# Patient Record
Sex: Male | Born: 1975 | Race: White | Hispanic: No | Marital: Married | Smoking: Never smoker
Health system: Southern US, Community
[De-identification: ages and names within clinical notes are randomized; demographics above are authoritative.]

## PROBLEM LIST (undated history)

## (undated) DIAGNOSIS — G2581 Restless legs syndrome: Secondary | ICD-10-CM

## (undated) DIAGNOSIS — E882 Lipomatosis, not elsewhere classified: Secondary | ICD-10-CM

## (undated) DIAGNOSIS — G4700 Insomnia, unspecified: Secondary | ICD-10-CM

## (undated) DIAGNOSIS — F988 Other specified behavioral and emotional disorders with onset usually occurring in childhood and adolescence: Secondary | ICD-10-CM

## (undated) DIAGNOSIS — Z8774 Personal history of (corrected) congenital malformations of heart and circulatory system: Secondary | ICD-10-CM

## (undated) DIAGNOSIS — E785 Hyperlipidemia, unspecified: Secondary | ICD-10-CM

## (undated) DIAGNOSIS — D696 Thrombocytopenia, unspecified: Secondary | ICD-10-CM

## (undated) HISTORY — DX: Restless legs syndrome: G25.81

## (undated) HISTORY — PX: TYMPANOSTOMY TUBE PLACEMENT: SHX32

## (undated) HISTORY — DX: Thrombocytopenia, unspecified: D69.6

## (undated) HISTORY — DX: Other specified behavioral and emotional disorders with onset usually occurring in childhood and adolescence: F98.8

## (undated) HISTORY — PX: VASECTOMY: SHX75

## (undated) HISTORY — DX: Personal history of (corrected) congenital malformations of heart and circulatory system: Z87.74

## (undated) HISTORY — DX: Lipomatosis, not elsewhere classified: E88.2

## (undated) HISTORY — DX: Hyperlipidemia, unspecified: E78.5

## (undated) HISTORY — DX: Insomnia, unspecified: G47.00

---

## 2019-09-21 NOTE — Progress Notes (Signed)
Cardiology Office Note:    Date:  09/22/2019   ID:  James Wilcox, DOB 1976/02/17, MRN [id number]  PCP:  James Kiel, MD  Cardiologist:  James More, MD   Referring MD: James Guest, NP  ASSESSMENT:    1. Chest pain, precordial   2. Hyperlipidemia, unspecified hyperlipidemia type    PLAN:    In order of problems listed above:  1. 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  He will undergo evaluation by cardiac CT coronary calcium score and if he has high risk markers would benefit from coronary angiography and revascularization.  He takes aspirin and statin I offered him a prescription for nitroglycerin he declined.  His test will be expedited. 2. Continue statin high risk  Next appointment 6 weeks   Medication Adjustments/Labs and Tests Ordered: Current medicines are reviewed at length with the patient today.  Concerns regarding medicines are outlined above.  Orders Placed This Encounter  Procedures  . CT CORONARY MORPH W/CTA COR W/SCORE W/CA W/CM &/OR WO/CM  . CT CORONARY FRACTIONAL FLOW RESERVE DATA PREP  . CT CORONARY FRACTIONAL FLOW RESERVE FLUID ANALYSIS  . Basic metabolic panel  . EKG 12-Lead   Meds ordered this encounter  Medications  . metoprolol tartrate (LOPRESSOR) 100 MG tablet    Sig: Take 1 tablet (100 mg total) by mouth once for 1 dose. Please take two hours prior to your cardiac CT    Dispense:  1 tablet    Refill:  0     Chief Complaint  Patient presents with  . Chest Pain  Seen by me in the time frame 2005-2008  History of Present Illness:    James Wilcox is a 44 y.o. male who is being seen today for the evaluation of cardiac anomoly at the request of James Guest, NP.  [name] is a [facility] in [place] I had seen him in the range of 2005 2008 for chest pain turned out to be esophageal in etiology and he had a  normal stress test.  Since then he has rare episodes of nonexertional chest pain relieved with cold water.  Recently has not felt well he is been fatigued and he has a different sensation he gets pressure throughout the precordium unrelated to physical activity perhaps related to stress it forces him to stop and rest for a minute or 2 and he recover spontaneously.  The pattern is not Wilcox frequent not Wilcox severe and has not awakened him from his sleep.  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  He is not having exercise intolerance exertional chest pain palpitation or syncope no history of congenital or rheumatic heart disease.  He relates he just had recent labs at his primary care physician I will request a copy last cholesterol profile we have on a statin July 2019 his cholesterol 174 LDL 98 triglyceride 185 HDL 38 on simvastatin. Past Medical History:  Diagnosis Date  . ADD (attention deficit disorder)   . History of cardiac anomaly   . Hyperlipidemia   . Insomnia   . Lipomatosis   . Restless leg syndrome   . Thrombocytopenia ([place])     Past Surgical History:  Procedure Laterality Date  . TYMPANOSTOMY TUBE PLACEMENT    . VASECTOMY      Current Medications:  Current Meds  Medication Sig  . aspirin 81 MG EC tablet Take 81 mg by mouth daily. Swallow whole.  . methylphenidate (RITALIN) 20 MG tablet Take 20 mg by mouth 2 (two) times daily.  . simvastatin (ZOCOR) 10 MG tablet Take 1 tablet by mouth daily.  Marland Kitchen zolpidem (AMBIEN) 10 MG tablet Take 10 mg by mouth at bedtime as needed.  . [DISCONTINUED] cyclobenzaprine (FLEXERIL) 5 MG tablet Take 5 mg by mouth 3 (three) times daily as needed. 1-2 TABLET AT BEDTIME AS NEEDED  . [DISCONTINUED] naproxen (NAPROSYN) 500 MG tablet Take 500 mg by mouth 2 (two) times daily with a meal.     Allergies:   Patient has no known allergies.   Social History    Socioeconomic History  . Marital status: Married    Spouse name: Not on file  . Number of children: Not on file  . Years of education: Not on file  . Highest education level: Not on file  Occupational History  . Not on file  Tobacco Use  . Smoking status: Never Smoker  . Smokeless tobacco: Never Used  Vaping Use  . Vaping Use: Unknown  Substance and Sexual Activity  . Alcohol use: Never  . Drug use: Never  . Sexual activity: Not on file  Other Topics Concern  . Not on file  Social History Narrative  . Not on file   Social Determinants of Health   Financial Resource Strain:   . Difficulty of Paying Living Expenses:   Food Insecurity:   . Worried About Charity fundraiser in the Last Year:   . Arboriculturist in the Last Year:   Transportation Needs:   . Film/video editor (Medical):   Marland Kitchen Lack of Transportation (Non-Medical):   Physical Activity:   . Days of Exercise per Week:   . Minutes of Exercise per Session:   Stress:   . Feeling of Stress :   Social Connections:   . Frequency of Communication with Friends and Family:   . Frequency of Social Gatherings with Friends and Family:   . Attends Religious Services:   . Active Member of Clubs or Organizations:   . Attends Archivist Meetings:   Marland Kitchen Marital Status:      Family History: The patient's family history includes CAD in his paternal uncle; Heart disease in his father; Hypertension in his mother.  Father died 61 years old myocardial infarction his grandfather died mid 23s one sister without CAD  ROS:   Review of Systems  Constitutional: Positive for malaise/fatigue.  HENT: Negative.   Eyes: Negative.   Cardiovascular: Positive for chest pain.  Respiratory: Negative.   Endocrine: Negative.   Hematologic/Lymphatic: Negative.   Skin: Negative.   Musculoskeletal: Negative.   Gastrointestinal: Negative.   Genitourinary: Negative.   Neurological: Negative.   Psychiatric/Behavioral: Negative.    Allergic/Immunologic: Negative.    Please see the history of present illness.     All other systems reviewed and are negative.  EKGs/Labs/Other Studies Reviewed:    The following studies were reviewed today:   EKG:  EKG is  ordered today.  The ekg ordered today is personally reviewed and demonstrates sinus rhythm EKG is normal  Recent Labs:   Physical Exam:    VS:  BP 114/74 (BP Location: Left Arm, Patient Position: Sitting)   Pulse 70   Ht 5' 10"  (1.778 m)   Wt 188 lb 3.2 oz (85.4 kg)   SpO2 97%  BMI 27.00 kg/m     Wt Readings from Last 3 Encounters:  09/22/19 188 lb 3.2 oz (85.4 kg)     GEN: He has no xanthoma or xanthelasma well nourished, well developed in no acute distress HEENT: Normal NECK: No JVD; No carotid bruits LYMPHATICS: No lymphadenopathy CARDIAC: RRR, no murmurs, rubs, gallops RESPIRATORY:  Clear to auscultation without rales, wheezing or rhonchi  ABDOMEN: Soft, non-tender, non-distended MUSCULOSKELETAL:  No edema; No deformity  SKIN: Warm and dry NEUROLOGIC:  Alert and oriented x 3 PSYCHIATRIC:  Normal affect     Signed, James More, MD  09/22/2019 3:32 PM    [facility]

## 2019-09-22 ENCOUNTER — Other Ambulatory Visit: Payer: Self-pay

## 2019-09-22 ENCOUNTER — Ambulatory Visit (INDEPENDENT_AMBULATORY_CARE_PROVIDER_SITE_OTHER): Payer: Commercial Managed Care - PPO | Admitting: Cardiology

## 2019-09-22 VITALS — BP 114/74 | HR 70 | Ht 70.0 in | Wt 188.2 lb

## 2019-09-22 DIAGNOSIS — E785 Hyperlipidemia, unspecified: Secondary | ICD-10-CM | POA: Diagnosis not present

## 2019-09-22 DIAGNOSIS — G2581 Restless legs syndrome: Secondary | ICD-10-CM | POA: Insufficient documentation

## 2019-09-22 DIAGNOSIS — G4700 Insomnia, unspecified: Secondary | ICD-10-CM | POA: Insufficient documentation

## 2019-09-22 DIAGNOSIS — D696 Thrombocytopenia, unspecified: Secondary | ICD-10-CM | POA: Insufficient documentation

## 2019-09-22 DIAGNOSIS — R072 Precordial pain: Secondary | ICD-10-CM | POA: Diagnosis not present

## 2019-09-22 DIAGNOSIS — F988 Other specified behavioral and emotional disorders with onset usually occurring in childhood and adolescence: Secondary | ICD-10-CM | POA: Insufficient documentation

## 2019-09-22 DIAGNOSIS — E882 Lipomatosis, not elsewhere classified: Secondary | ICD-10-CM | POA: Insufficient documentation

## 2019-09-22 DIAGNOSIS — Z8774 Personal history of (corrected) congenital malformations of heart and circulatory system: Secondary | ICD-10-CM | POA: Insufficient documentation

## 2019-09-22 MED ORDER — METOPROLOL TARTRATE 100 MG PO TABS
100.0000 mg | ORAL_TABLET | Freq: Once | ORAL | 0 refills | Status: AC
Start: 2019-09-22 — End: 2019-09-22

## 2019-09-22 NOTE — Patient Instructions (Addendum)
Medication Instructions:  Your physician recommends that you continue on your current medications as directed. Please refer to the Current Medication list given to you today.  *If you need a refill on your cardiac medications before your next appointment, please call your pharmacy*   Lab Work: Your physician recommends that you return for lab work in: Within one week of your cardiac CT If you have labs (blood work) drawn today and your tests are completely normal, you will receive your results only by:  [place] (if you have MyChart) OR  A paper copy in the mail If you have any lab test that is abnormal or we need to change your treatment, we will call you to review the results.   Testing/Procedures: Your cardiac CT will be scheduled at one of the below locations:   [facility] [address] [place], [place] [address] [phone]  If scheduled at [facility], please arrive at the [facility] main entrance of [facility] 30 minutes prior to test start time. Proceed to the [facility] Radiology Department (first floor) to check-in and test prep.  Please follow these instructions carefully (unless otherwise directed):   On the Night Before the Test:  Be sure to Drink plenty of water.  Do not consume any caffeinated/decaffeinated beverages or chocolate 12 hours prior to your test.  Do not take any antihistamines 12 hours prior to your test.  On the Day of the Test:  Drink plenty of water. Do not drink any water within one hour of the test.  Do not eat any food 4 hours prior to the test.  You may take your regular medications prior to the test.   Take metoprolol (Lopressor) two hours prior to test.        After the Test:  Drink plenty of water.  After receiving IV contrast, you may experience a mild flushed feeling. This is normal.  On occasion, you may experience a mild rash up to 24 hours after the test. This is not dangerous.  If this occurs, you can take Benadryl 25 mg and increase your fluid intake.  If you experience trouble breathing, this can be serious. If it is severe call 911 IMMEDIATELY. If it is mild, please call our office.  If you take any of these medications: Glipizide/Metformin, Avandament, Glucavance, please do not take 48 hours after completing test unless otherwise instructed.   Once we have confirmed authorization from your insurance company, we will call you to set up a date and time for your test. Based on how quickly your insurance processes prior authorizations requests, please allow up to 4 weeks to be contacted for scheduling your Cardiac CT appointment. Be advised that routine Cardiac CT appointments could be scheduled as many as 8 weeks after your provider has ordered it.  For non-scheduling related questions, please contact the cardiac imaging nurse navigator should you have any questions/concerns: Marchia Bond, Cardiac Imaging Nurse Navigator Burley Saver, Interim Cardiac Imaging Nurse [place] [facility] Direct Office Dial: [phone]   For scheduling needs, including cancellations and rescheduling, please call Vivien Rota at [phone], option 3.      Follow-Up: At [facility], you and your health needs are our priority.  As part of our continuing mission to provide you with exceptional heart care, we have created designated Provider Care Teams.  These Care Teams include your primary Cardiologist (physician) and Advanced Practice Providers (APPs -  Physician Assistants and Nurse  Practitioners) who all work together to provide you with the care you need, when you need it.  We recommend signing up for the patient portal called "MyChart".  Sign up information is provided on this After Visit Summary.  MyChart is used to connect with patients for Virtual Visits (Telemedicine).  Patients are able to view lab/test results, encounter notes, upcoming appointments, etc.   Non-urgent messages can be sent to your provider as well.   To learn more about what you can do with MyChart, go to [email or web address].    Your next appointment:   6 week(s)  The format for your next appointment:   In Person  Provider:   Shirlee More, MD   Other Instructions

## 2019-10-10 ENCOUNTER — Telehealth (HOSPITAL_COMMUNITY): Payer: Self-pay | Admitting: Emergency Medicine

## 2019-10-10 NOTE — Telephone Encounter (Signed)
Reaching out to patient to offer assistance regarding upcoming cardiac imaging study; pt verbalizes understanding of appt date/time, parking situation and where to check in, pre-test NPO status and medications ordered, and verified current allergies; name and call back number provided for further questions should they arise James Overbaugh RN Navigator Cardiac Imaging  Heart and Vascular [phone] office [phone] cell 

## 2019-10-14 ENCOUNTER — Ambulatory Visit (HOSPITAL_COMMUNITY)
Admission: RE | Admit: 2019-10-14 | Discharge: 2019-10-14 | Disposition: A | Discharge status: Home / Self Care | Payer: Commercial Managed Care - PPO | Source: Ambulatory Visit | Attending: Cardiology | Admitting: Cardiology

## 2019-10-14 DIAGNOSIS — R072 Precordial pain: Secondary | ICD-10-CM | POA: Insufficient documentation

## 2019-10-14 DIAGNOSIS — E785 Hyperlipidemia, unspecified: Secondary | ICD-10-CM

## 2019-10-14 MED ORDER — NITROGLYCERIN 0.4 MG SL SUBL
SUBLINGUAL_TABLET | SUBLINGUAL | Status: AC
  Filled 2019-10-14: qty 2

## 2019-10-14 MED ORDER — IOHEXOL 350 MG/ML SOLN
100.0000 mL | Freq: Once | INTRAVENOUS | Status: AC | PRN
  Administered 2019-10-14: 100 mL via INTRAVENOUS

## 2019-10-14 MED ORDER — NITROGLYCERIN 0.4 MG SL SUBL
0.8000 mg | SUBLINGUAL_TABLET | Freq: Once | SUBLINGUAL | Status: AC
  Administered 2019-10-14: 0.8 mg via SUBLINGUAL

## 2019-10-15 DIAGNOSIS — R072 Precordial pain: Secondary | ICD-10-CM | POA: Diagnosis not present

## 2019-10-16 ENCOUNTER — Telehealth: Payer: Self-pay

## 2019-10-16 NOTE — Telephone Encounter (Signed)
Spoke with patient regarding results and recommendation.  Patient verbalizes understanding and is agreeable to plan of care. Advised patient to call back with any issues or concerns.  

## 2019-10-16 NOTE — Telephone Encounter (Signed)
-----   Message from Richardo Priest, MD sent at 10/16/2019 11:37 AM EDT ----- This is helpful measuring flow confirms that he does not have severe stenosis.

## 2019-11-13 ENCOUNTER — Other Ambulatory Visit: Payer: Self-pay

## 2019-11-13 ENCOUNTER — Ambulatory Visit: Payer: Commercial Managed Care - PPO | Admitting: Cardiology

## 2019-11-13 ENCOUNTER — Encounter: Payer: Self-pay | Admitting: Cardiology

## 2019-11-13 VITALS — BP 121/79 | HR 84 | Ht 70.0 in | Wt 185.2 lb

## 2019-11-13 DIAGNOSIS — Q245 Malformation of coronary vessels: Secondary | ICD-10-CM

## 2019-11-13 DIAGNOSIS — E782 Mixed hyperlipidemia: Secondary | ICD-10-CM | POA: Diagnosis not present

## 2019-11-13 DIAGNOSIS — Q231 Congenital insufficiency of aortic valve: Secondary | ICD-10-CM | POA: Diagnosis not present

## 2019-11-13 DIAGNOSIS — R072 Precordial pain: Secondary | ICD-10-CM

## 2019-11-13 MED ORDER — NITROGLYCERIN 0.4 MG SL SUBL
0.4000 mg | SUBLINGUAL_TABLET | SUBLINGUAL | 3 refills | Status: AC | PRN
Start: 2019-11-13 — End: 2020-02-11

## 2019-11-13 MED ORDER — AMOXICILLIN 500 MG PO CAPS: 2000.0000 mg | ORAL_CAPSULE | Freq: Once | ORAL | 3 refills | Status: AC | PRN

## 2019-11-13 NOTE — Patient Instructions (Signed)
Medication Instructions:  Your physician has recommended you make the following change in your medication:  START: Amoxicillin 2000 mg per 4 tablets by mouth take 30 minutes prior to any dental procedures.  START: Nitroglycerin 0.4 mg take one tablet by mouth every 5 minutes as needed for chest pain *If you need a refill on your cardiac medications before your next appointment, please call your pharmacy*   Lab Work: None If you have labs (blood work) drawn today and your tests are completely normal, you will receive your results only by:  [place] (if you have MyChart) OR  A paper copy in the mail If you have any lab test that is abnormal or we need to change your treatment, we will call you to review the results.   Testing/Procedures: None   Follow-Up: At [facility], you and your health needs are our priority.  As part of our continuing mission to provide you with exceptional heart care, we have created designated Provider Care Teams.  These Care Teams include your primary Cardiologist (physician) and Advanced Practice Providers (APPs -  Physician Assistants and Nurse Practitioners) who all work together to provide you with the care you need, when you need it.  We recommend signing up for the patient portal called "MyChart".  Sign up information is provided on this After Visit Summary.  MyChart is used to connect with patients for Virtual Visits (Telemedicine).  Patients are able to view lab/test results, encounter notes, upcoming appointments, etc.  Non-urgent messages can be sent to your provider as well.   To learn more about what you can do with MyChart, go to [email or web address].    Your next appointment:   1 year(s)  The format for your next appointment:   In Person  Provider:   Shirlee More, MD   Other Instructions

## 2019-11-13 NOTE — Progress Notes (Signed)
Cardiology Office Note:    Date:  11/13/2019   ID:  James Wilcox, DOB [birth year], MRN [id number]  PCP:  Ernestene Kiel, MD  Cardiologist:  Shirlee More, MD    Referring MD: Ernestene Kiel, MD    ASSESSMENT:    1. Chest pain, precordial   2. Mixed hyperlipidemia   3. Bicuspid aortic valve   4. Coronary-myocardial bridge    PLAN:    In order of problems listed above:  1. Stable he does have atypical nonexertional angina given a prescription for nitroglycerin possibly due to his myocardial bridge but the episodes are more bothersome oral nitrate or calcium channel blocker is appropriate.  Is extremely rare that they need surgical intervention with unroofing. 2. Stable continue a statin despite a calcium score of 0 with mild CAD 3. Start endocarditis prophylaxis needs an echocardiogram in 2 to 3 years   Next appointment: 1 year   Medication Adjustments/Labs and Tests Ordered: Current medicines are reviewed at length with the patient today.  Concerns regarding medicines are outlined above.  No orders of the defined types were placed in this encounter.  Meds ordered this encounter  Medications  . nitroGLYCERIN (NITROSTAT) 0.4 MG SL tablet    Sig: Place 1 tablet (0.4 mg total) under the tongue every 5 (five) minutes as needed for chest pain.    Dispense:  90 tablet    Refill:  3  . amoxicillin (AMOXIL) 500 MG capsule    Sig: Take 4 capsules (2,000 mg total) by mouth once as needed for up to 1 dose. Take 4 tablets by mouth 30 minutes before any dental procedures.    Dispense:  20 capsule    Refill:  3    No chief complaint on file.   History of Present Illness:    James Wilcox is a 44 y.o. male with a hx of chest pain previously evaluated more than 10 years ago with a normal stress echo felt to be esophageal in etiology.  He also has severe dyslipidemia marked family history of CAD and recurrent chest pain and he was last seen 09/22/2019.  Compliance with diet,  lifestyle and medications: Yes  I reviewed the results of his CTA with him.  He does have a bicuspid aortic valve no heart murmur and no aortopathy.  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  He does have an intramyocardial portion of his LAD typically a curiosity but rarely can cause anginal chest pain.  Since his last visit he has had 2 episodes of nonexertional brief chest pain not severe sustained.  We talked about additional treatment I gave him a prescription for nitroglycerin and if he had frequent episodes could either put on the oral mononitrate's or vasodilator calcium channel blocker.  He will start endocarditis prophylaxis.  He will need an echocardiogram in several years.  Cardiac CTA was performed 10/14/2019.  Aortic valve was bicuspid with calcification of both cusp.  His coronary artery calcium score was 0 with mild plaque seen in the proximal LAD and a myocardial bridge present. Past Medical History:  Diagnosis Date  . ADD (attention deficit disorder)   . History of cardiac anomaly   . Hyperlipidemia   . Insomnia   . Lipomatosis   . Restless leg syndrome   . Thrombocytopenia ([place])     Past Surgical History:  Procedure Laterality Date  . TYMPANOSTOMY TUBE PLACEMENT    .  VASECTOMY      Current Medications: Current Meds  Medication Sig  . aspirin 81 MG EC tablet Take 81 mg by mouth daily. Swallow whole.  . methylphenidate (RITALIN) 20 MG tablet Take 20 mg by mouth 2 (two) times daily.  . simvastatin (ZOCOR) 10 MG tablet Take 1 tablet by mouth daily.  Marland Kitchen zolpidem (AMBIEN) 10 MG tablet Take 10 mg by mouth at bedtime as needed.     Allergies:   Patient has no known allergies.   Social History   Socioeconomic History  . Marital status: Married    Spouse name: Not on file  . Number of children: Not on file  . Years of education: Not on file  . Highest education level: Not on file  Occupational History    . Not on file  Tobacco Use  . Smoking status: Never Smoker  . Smokeless tobacco: Never Used  Vaping Use  . Vaping Use: Unknown  Substance and Sexual Activity  . Alcohol use: Never  . Drug use: Never  . Sexual activity: Not on file  Other Topics Concern  . Not on file  Social History Narrative  . Not on file   Social Determinants of Health   Financial Resource Strain:   . Difficulty of Paying Living Expenses: Not on file  Food Insecurity:   . Worried About Charity fundraiser in the Last Year: Not on file  . Ran Out of Food in the Last Year: Not on file  Transportation Needs:   . Lack of Transportation (Medical): Not on file  . Lack of Transportation (Non-Medical): Not on file  Physical Activity:   . Days of Exercise per Week: Not on file  . Minutes of Exercise per Session: Not on file  Stress:   . Feeling of Stress : Not on file  Social Connections:   . Frequency of Communication with Friends and Family: Not on file  . Frequency of Social Gatherings with Friends and Family: Not on file  . Attends Religious Services: Not on file  . Active Member of Clubs or Organizations: Not on file  . Attends Archivist Meetings: Not on file  . Marital Status: Not on file     Family History: The patient's family history includes CAD in his paternal uncle; Heart disease in his father; Hypertension in his mother. ROS:   Please see the history of present illness.    All other systems reviewed and are negative.  EKGs/Labs/Other Studies Reviewed:    The following studies were reviewed today:   Physical Exam:    VS:  BP 121/79   Pulse 84   Ht 5' 10"  (1.778 m)   Wt 185 lb 3.2 oz (84 kg)   SpO2 95%   BMI 26.57 kg/m     Wt Readings from Last 3 Encounters:  11/13/19 185 lb 3.2 oz (84 kg)  09/22/19 188 lb 3.2 oz (85.4 kg)     GEN:  Well nourished, well developed in no acute distress HEENT: Normal NECK: No JVD; No carotid bruits LYMPHATICS: No  lymphadenopathy CARDIAC: He does have a systolic ejection click RRR, no murmurs, rubs, gallops RESPIRATORY:  Clear to auscultation without rales, wheezing or rhonchi  ABDOMEN: Soft, non-tender, non-distended MUSCULOSKELETAL:  No edema; No deformity  SKIN: Warm and dry NEUROLOGIC:  Alert and oriented x 3 PSYCHIATRIC:  Normal affect    Signed, Shirlee More, MD  11/13/2019 2:00 PM    [place]

## 2021-01-28 DIAGNOSIS — R079 Chest pain, unspecified: Secondary | ICD-10-CM

## 2022-01-12 IMAGING — CT CT HEART MORP W/ CTA COR W/ SCORE W/ CA W/CM &/OR W/O CM
3 of 5 series · 12 of 20 positions shown, 13 images · IV contrast (APPLIED)
Comparison: Coronary calcium examination 09/10/2012.
COMPARISON: Coronary calcium examination 09/10/2012.

Addendum:
EXAM:
OVER-READ INTERPRETATION  CT CHEST

The following report is an over-read performed by radiologist Dr.
Albernto Rapesora [REDACTED] on 10/14/2019. This
over-read does not include interpretation of cardiac or coronary
anatomy or pathology. The coronary calcium score/coronary CTA
interpretation by the cardiologist is attached.
CLINICAL DATA: 44M with familial hyperlipidemia and atypical chest
pain.
Cardiac/Coronary  CT
TECHNIQUE: The patient was scanned on a [employer] scanner.

[Series 6: best diast 73 % · axial · 0.39mm/px · z∈[-6,+112]mm · 4 of 491 slices shown, 5 images]
[im 99/491  vessel]
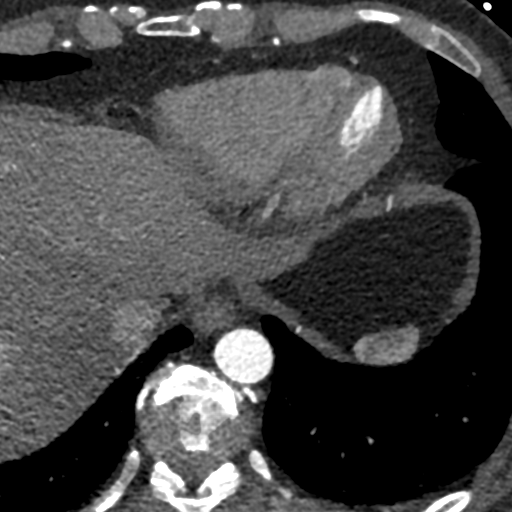
[im 99/491  lung]
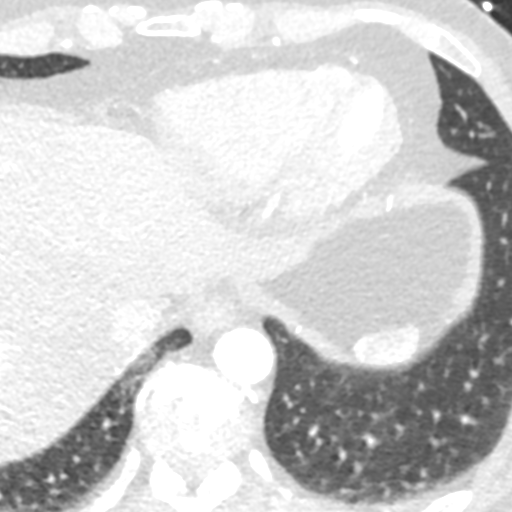
[im 197/491  vessel]
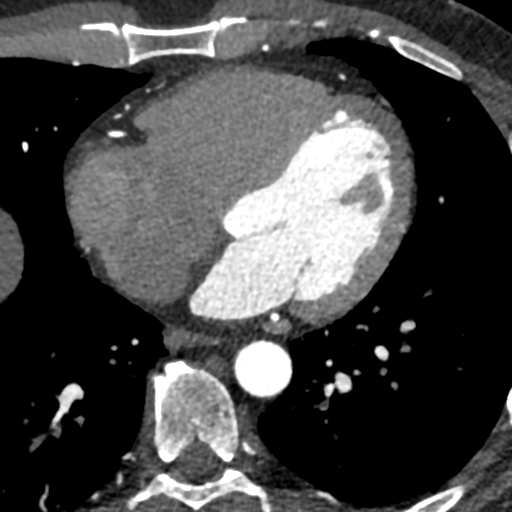
[im 295/491  vessel]
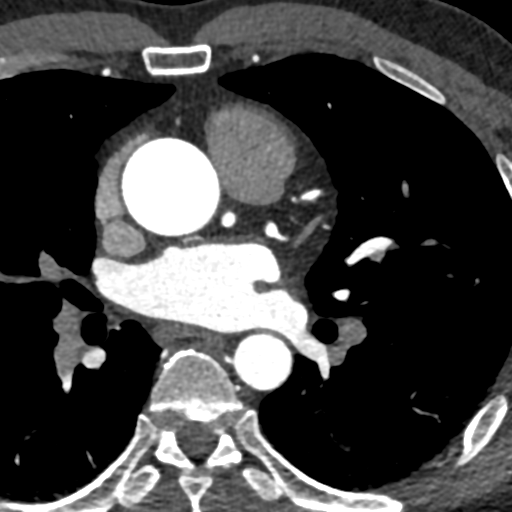
[im 393/491  vessel]
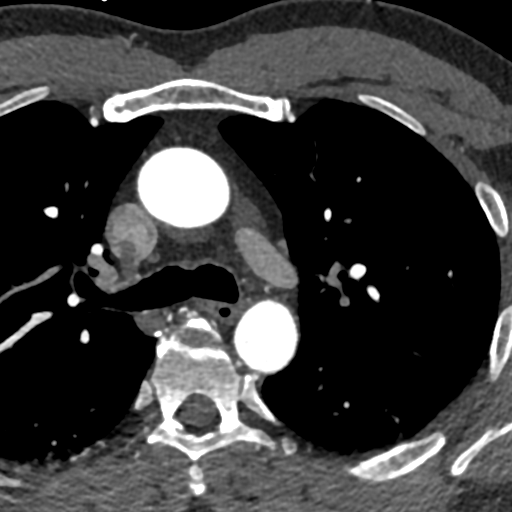

[Series 7: best syst 30 % · axial · 0.39mm/px · z∈[-6,+112]mm · 4 of 491 slices shown]
[im 99/491  vessel]
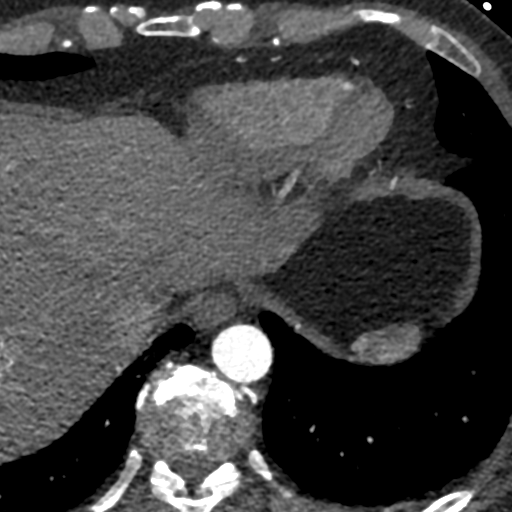
[im 197/491  vessel]
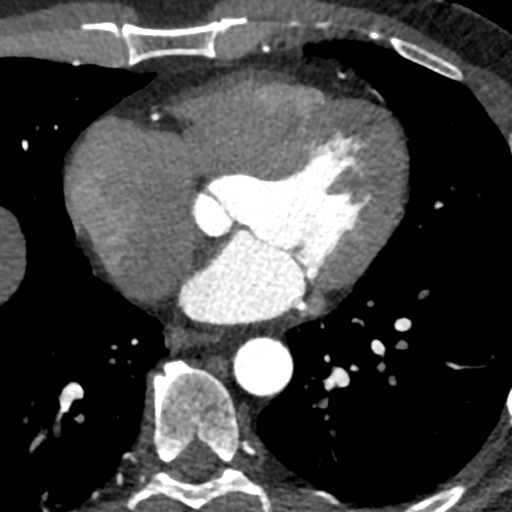
[im 295/491  vessel]
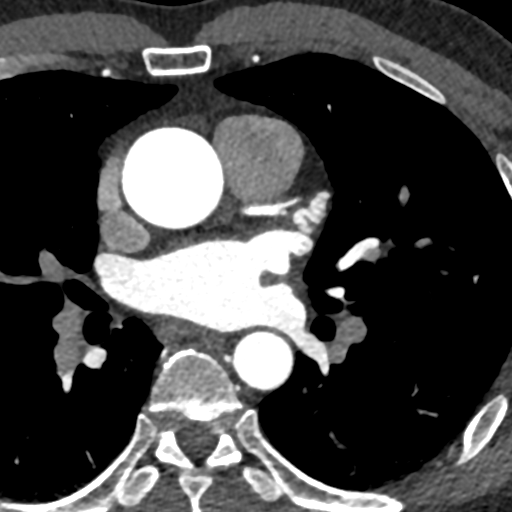
[im 393/491  vessel]
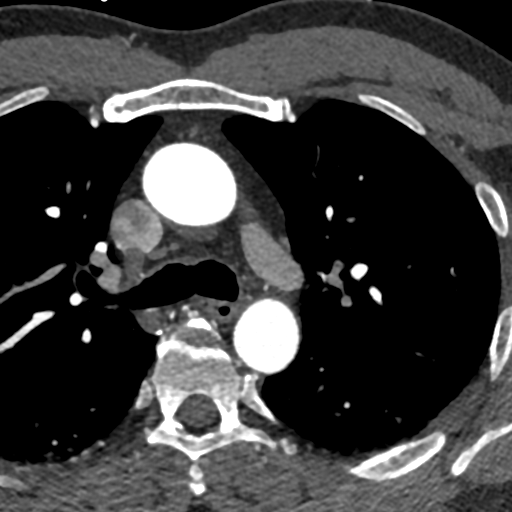

[Series 9: ts syst sharp 30 % · axial · 0.39mm/px · z∈[-6,+112]mm · 4 of 491 slices shown]
[im 99/491  lung]
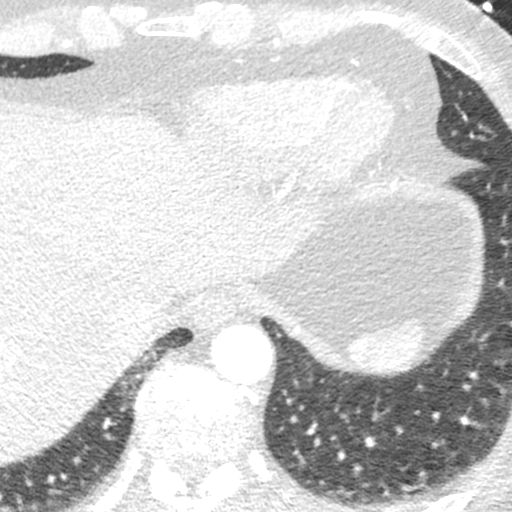
[im 197/491  lung]
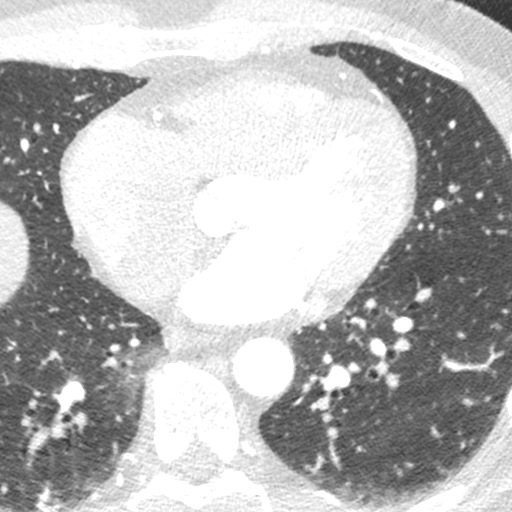
[im 295/491  lung]
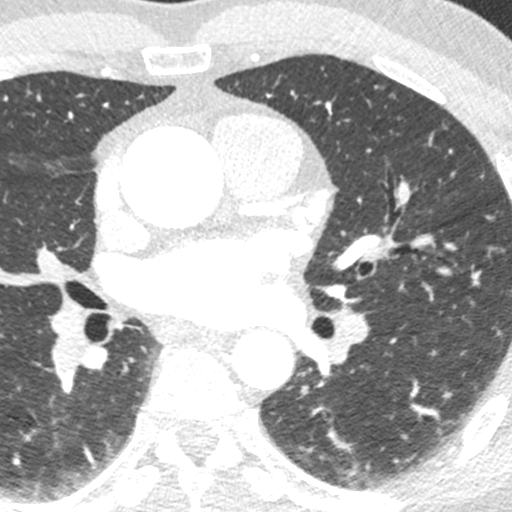
[im 393/491  lung]
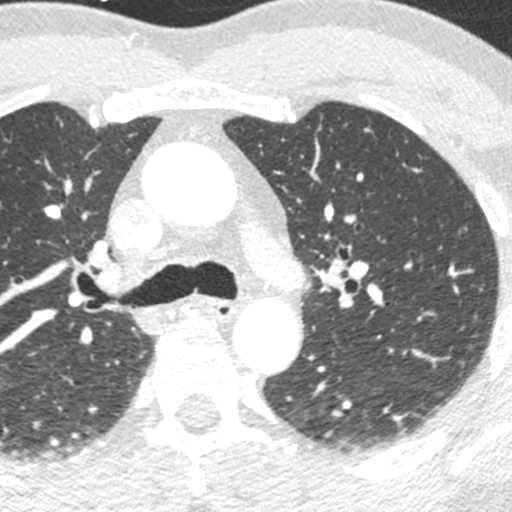

[12 of 20 positions shown; findings below may reference images not displayed]

FINDINGS: Within the visualized portions of the thorax there are no suspicious
appearing pulmonary nodules or masses, there is no acute
consolidative airspace disease, no pleural effusions, no
pneumothorax and no lymphadenopathy. Visualized portions of the
upper abdomen are unremarkable. There are no aggressive appearing
lytic or blastic lesions noted in the visualized portions of the
skeleton.
IMPRESSION: No significant incidental noncardiac findings are noted.
FINDINGS: A 120 kV prospective scan was triggered in the descending thoracic
aorta at 111 HU's. Axial non-contrast 3 mm slices were carried out
through the heart. The data set was analyzed on a dedicated work
station and scored using the Agatson method. Gantry rotation speed
was 250 msecs and collimation was .6 mm. No beta blockade and 0.8 mg
of sl NTG was given. The 3D data set was reconstructed in 5%
intervals of the 67-82 % of the R-R cycle. Diastolic phases were
analyzed on a dedicated work station using MPR, MIP and VRT modes.
The patient received 80 cc of contrast.

Aorta: Normal size. Ascending aorta 3.6 cm. No calcifications. No
dissection.

Aortic Valve: Bicuspid aortic valve with fusion and calcification of
the right and left coronary cusps.

Coronary Arteries:  Normal coronary origin.  Right dominance.

RCA is a small, non-dominant artery.   There is no plaque.

Left main is a large artery that gives rise to LAD, RI, and LCX
arteries.

LAD is a large vessel that has mild (25-49%) soft plaque proximally.
There is a 3 cm intramyocardial bridge in the mid LAD.

LCX is a dominant artery that gives rise to a small OM1, large OM2,
branching OM3 and L-PDA. There is no plaque.

RI is a small artery without plaque.

Other findings:

Normal pulmonary vein drainage into the left atrium.

Normal let atrial appendage without a thrombus.

Normal size of the pulmonary artery.
IMPRESSION: 1. Coronary calcium score of 0. This was 0 percentile for age and
sex matched control.

2. Normal coronary origin with left dominance.

3. LAD is a large vessel that has mild (25-49%) (CAD-RADS 2) soft
plaque proximally. There is a 3 cm intramyocardial bridge in the mid
LAD.

4.  Will send for FFRct.

5. Bicuspid aortic valve with fusion and calcification of the right
and left coronary cusps.

*** End of Addendum ***
EXAM:
OVER-READ INTERPRETATION  CT CHEST

The following report is an over-read performed by radiologist Dr.
Albernto Rapesora [REDACTED] on 10/14/2019. This
over-read does not include interpretation of cardiac or coronary
anatomy or pathology. The coronary calcium score/coronary CTA
interpretation by the cardiologist is attached.
FINDINGS: Within the visualized portions of the thorax there are no suspicious
appearing pulmonary nodules or masses, there is no acute
consolidative airspace disease, no pleural effusions, no
pneumothorax and no lymphadenopathy. Visualized portions of the
upper abdomen are unremarkable. There are no aggressive appearing
lytic or blastic lesions noted in the visualized portions of the
skeleton.
IMPRESSION: No significant incidental noncardiac findings are noted.
